# Patient Record
Sex: Female | Born: 1955 | Race: White | Hispanic: No | Smoking: Never smoker
Health system: Southern US, Community
[De-identification: ages and names within clinical notes are randomized; demographics above are authoritative.]

## PROBLEM LIST (undated history)

## (undated) DIAGNOSIS — K259 Gastric ulcer, unspecified as acute or chronic, without hemorrhage or perforation: Secondary | ICD-10-CM

## (undated) DIAGNOSIS — I10 Essential (primary) hypertension: Secondary | ICD-10-CM

## (undated) DIAGNOSIS — E7800 Pure hypercholesterolemia, unspecified: Secondary | ICD-10-CM

---

## 2000-02-23 ENCOUNTER — Ambulatory Visit (HOSPITAL_BASED_OUTPATIENT_CLINIC_OR_DEPARTMENT_OTHER): Admission: RE | Admit: 2000-02-23 | Discharge: 2000-02-23 | Payer: Self-pay | Admitting: Family Medicine

## 2004-05-13 ENCOUNTER — Ambulatory Visit: Payer: Self-pay | Admitting: Family Medicine

## 2004-06-16 DIAGNOSIS — K259 Gastric ulcer, unspecified as acute or chronic, without hemorrhage or perforation: Secondary | ICD-10-CM

## 2004-06-16 HISTORY — DX: Gastric ulcer, unspecified as acute or chronic, without hemorrhage or perforation: K25.9

## 2005-09-10 ENCOUNTER — Ambulatory Visit: Payer: Self-pay

## 2005-12-08 ENCOUNTER — Ambulatory Visit: Payer: Self-pay | Admitting: Unknown Physician Specialty

## 2006-04-15 ENCOUNTER — Ambulatory Visit: Payer: Self-pay | Admitting: Unknown Physician Specialty

## 2007-01-28 ENCOUNTER — Ambulatory Visit: Payer: Self-pay | Admitting: Family Medicine

## 2008-11-28 ENCOUNTER — Ambulatory Visit: Payer: Self-pay

## 2010-01-07 ENCOUNTER — Ambulatory Visit: Payer: Self-pay | Admitting: Family Medicine

## 2011-04-14 ENCOUNTER — Ambulatory Visit: Payer: Self-pay | Admitting: Family Medicine

## 2012-07-06 ENCOUNTER — Ambulatory Visit: Payer: Self-pay

## 2013-06-02 ENCOUNTER — Ambulatory Visit: Payer: Self-pay | Admitting: Internal Medicine

## 2013-06-02 LAB — DOT URINE DIP
Blood: NEGATIVE
Glucose,UR: NEGATIVE mg/dL (ref 0–75)
Specific Gravity: 1.025 (ref 1.003–1.030)

## 2013-07-11 ENCOUNTER — Ambulatory Visit: Payer: Self-pay

## 2014-05-17 ENCOUNTER — Ambulatory Visit: Payer: Self-pay

## 2014-05-17 LAB — DOT URINE DIP
Blood: NEGATIVE
Glucose,UR: NEGATIVE
PROTEIN: NEGATIVE
SPECIFIC GRAVITY: 1.02 (ref 1.000–1.030)

## 2014-09-29 ENCOUNTER — Other Ambulatory Visit: Payer: Self-pay | Admitting: Oncology

## 2014-09-29 DIAGNOSIS — Z139 Encounter for screening, unspecified: Secondary | ICD-10-CM

## 2014-11-01 ENCOUNTER — Ambulatory Visit: Payer: Self-pay

## 2014-11-01 ENCOUNTER — Ambulatory Visit: Payer: Self-pay | Attending: Oncology

## 2014-11-01 ENCOUNTER — Ambulatory Visit
Admission: RE | Admit: 2014-11-01 | Discharge: 2014-11-01 | Disposition: A | Discharge status: Home / Self Care | Payer: Self-pay | Source: Ambulatory Visit | Attending: Oncology | Admitting: Oncology

## 2014-11-01 VITALS — BP 111/75 | HR 60 | Temp 98.3°F | Resp 16 | Ht 62.6 in | Wt 175.3 lb

## 2014-11-01 DIAGNOSIS — Z0000 Encounter for general adult medical examination without abnormal findings: Secondary | ICD-10-CM

## 2014-11-01 NOTE — Progress Notes (Signed)
Subjective:     Patient ID: Kristi Howell, female   DOB: Nov 08, 1955, 59 y.o.   MRN: [id number]  HPI   Review of Systems     Objective:   Physical Exam  Pulmonary/Chest: Right breast exhibits no inverted nipple, no mass, no nipple discharge, no skin change and no tenderness. Left breast exhibits no inverted nipple, no mass, no nipple discharge, no skin change and no tenderness. Breasts are symmetrical.       Assessment:    Patient screened, and meets [facility] eligibility.  Patient does not have insurance, Medicare or Medicaid.  Handout given on Affordable Care Act.  [facility].CBE unremarkableInstructed patient on breast self-exam using teach back method     Plan:  Sent for bilateral screening mammogram.

## 2014-11-02 NOTE — Progress Notes (Unsigned)
Letter mailed from [facility] to notify of normal mammogram results.  Patient to return in one year for annual screening.  Copy to HSIS. 

## 2015-05-16 ENCOUNTER — Encounter: Payer: Self-pay | Admitting: Emergency Medicine

## 2015-05-16 ENCOUNTER — Ambulatory Visit
Admission: EM | Admit: 2015-05-16 | Discharge: 2015-05-16 | Disposition: A | Discharge status: Home / Self Care | Payer: Self-pay | Attending: Family Medicine | Admitting: Family Medicine

## 2015-05-16 DIAGNOSIS — Z021 Encounter for pre-employment examination: Secondary | ICD-10-CM

## 2015-05-16 DIAGNOSIS — Z024 Encounter for examination for driving license: Secondary | ICD-10-CM

## 2015-05-16 HISTORY — DX: Gastric ulcer, unspecified as acute or chronic, without hemorrhage or perforation: K25.9

## 2015-05-16 HISTORY — DX: Essential (primary) hypertension: I10

## 2015-05-16 HISTORY — DX: Pure hypercholesterolemia, unspecified: E78.00

## 2015-05-16 LAB — DEPT OF TRANSP DIPSTICK, URINE (ARMC ONLY)
Glucose, UA: NEGATIVE mg/dL
Protein, ur: NEGATIVE mg/dL
Specific Gravity, Urine: 1.02 (ref 1.005–1.030)

## 2015-05-16 NOTE — ED Provider Notes (Signed)
CSN: [id number]     Arrival date & time 05/16/15  0865 History   First MD Initiated Contact with Patient 05/16/15 [phone]    Nurses notes were reviewed. Chief Complaint  Patient presents with  . Employment Physical  DOT exam. Patient with history of hypertension , hyperlipidemia and peptic ulcer disease.  (Consider location/radiation/quality/duration/timing/severity/associated sxs/prior Treatment) The history is provided by the patient. No language interpreter was used.    Past Medical History  Diagnosis Date  . Hypertension   . Hypercholesterolemia   . Stomach ulcer 2006   History reviewed. No pertinent past surgical history. History reviewed. No pertinent family history. Social History  Substance Use Topics  . Smoking status: Never Smoker   . Smokeless tobacco: None  . Alcohol Use: No   OB History    No data available     Review of Systems  All other systems reviewed and are negative.   Allergies  Review of patient's allergies indicates no known allergies.  Home Medications   Prior to Admission medications   Medication Sig Start Date End Date Taking? Authorizing Provider  aspirin EC 81 MG tablet Take 81 mg by mouth daily.   Yes Historical Provider, MD  atorvastatin (LIPITOR) 20 MG tablet Take 20 mg by mouth daily.   Yes Historical Provider, MD  carvedilol (COREG) 25 MG tablet Take 25 mg by mouth 1 day or 1 dose.   Yes Historical Provider, MD  enalapril (VASOTEC) 20 MG tablet Take 20 mg by mouth 2 (two) times daily.   Yes Historical Provider, MD  omeprazole (PRILOSEC) 10 MG capsule Take 10 mg by mouth daily.   Yes Historical Provider, MD  triamterene-hydrochlorothiazide (DYAZIDE) 37.5-25 MG capsule Take 1 capsule by mouth daily.   Yes Historical Provider, MD   Meds Ordered and Administered this Visit  Medications - No data to display  BP 110/75 mmHg  Pulse 62  Temp(Src) 97.7 F (36.5 C) (Oral)  Resp 16  Ht  (1.575 m)  Wt 171 lb 8 oz (77.792 kg)  BMI 31.36  kg/m2  SpO2 98% No data found.   Physical Exam  Constitutional: She is oriented to person, place, and time. She appears well-developed and well-nourished.  HENT:  Head: Normocephalic and atraumatic.  Eyes: Pupils are equal, round, and reactive to light.  Neck: Normal range of motion. Neck supple.  Cardiovascular: Normal rate, regular rhythm and normal heart sounds.   Pulmonary/Chest: Effort normal and breath sounds normal. No respiratory distress.  Abdominal: Soft. Bowel sounds are normal. She exhibits no distension.  Musculoskeletal: Normal range of motion.  Neurological: She is alert and oriented to person, place, and time.  Skin: Skin is warm and dry.  Psychiatric: She has a normal mood and affect.  Vitals reviewed.   ED Course  Procedures (including critical care time)  Labs Review Labs Reviewed  DEPT OF TRANSP DIPSTICK, URINE([facility] ONLY) - Abnormal; Notable for the following:    Hgb urine dipstick 1+ (*)    All other components within normal limits   Results for orders placed or performed during the hospital encounter of 05/16/15  Dept of Transp dipstick, urine  Result Value Ref Range   Protein, ur NEGATIVE NEGATIVE mg/dL   Glucose, UA NEGATIVE NEGATIVE mg/dL   Specific Gravity, Urine 1.020 1.005 - 1.030   Hgb urine dipstick 1+ (A) NEGATIVE    Imaging Review No results found.   Visual Acuity Review  Right Eye Distance:   Left Eye Distance:  Bilateral Distance:    Right Eye Near:   Left Eye Near:    Bilateral Near:         MDM   1. 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 Patient did have hematuria and she she was informed of that finding and informed that she needs follow-up with the doctor in 2- 6 months for follow-up  Hassan RowanEugene Tahlia Deamer, MD 05/16/15 1458

## 2015-05-16 NOTE — ED Notes (Signed)
DOT physical. 

## 2015-10-08 ENCOUNTER — Other Ambulatory Visit: Payer: Self-pay | Admitting: Family Medicine

## 2015-11-07 ENCOUNTER — Ambulatory Visit: Payer: Self-pay | Attending: Oncology | Admitting: *Deleted

## 2015-11-07 ENCOUNTER — Ambulatory Visit
Admission: RE | Admit: 2015-11-07 | Discharge: 2015-11-07 | Disposition: A | Discharge status: Home / Self Care | Payer: Self-pay | Source: Ambulatory Visit | Attending: Oncology | Admitting: Oncology

## 2015-11-07 ENCOUNTER — Encounter: Payer: Self-pay | Admitting: *Deleted

## 2015-11-07 VITALS — BP 104/72 | HR 55 | Temp 97.6°F | Ht 64.17 in | Wt 176.4 lb

## 2015-11-07 DIAGNOSIS — Z0000 Encounter for general adult medical examination without abnormal findings: Secondary | ICD-10-CM

## 2015-11-07 NOTE — Patient Instructions (Signed)
Gave patient hand-out, Women Staying Healthy, Active and Well from BCCCP, with education on breast health, pap smears, heart and colon health. 

## 2015-11-07 NOTE — Progress Notes (Signed)
Subjective:     Patient ID: Kristi Howell, female   DOB: June 11, 1956, 60 y.o.   MRN: [id number]  HPI   Review of Systems     Objective:   Physical Exam  Pulmonary/Chest: Right breast exhibits no inverted nipple, no mass, no nipple discharge, no skin change and no tenderness. Left breast exhibits no inverted nipple, no mass, no nipple discharge, no skin change and no tenderness. Breasts are symmetrical.  Abdominal: There is no splenomegaly or hepatomegaly.  Genitourinary: No labial fusion. There is no rash, tenderness, lesion or injury on the right labia. There is no rash, tenderness, lesion or injury on the left labia. Cervix exhibits no motion tenderness, no discharge and no friability. Right adnexum displays no mass, no tenderness and no fullness. Left adnexum displays no mass, no tenderness and no fullness. No erythema, tenderness or bleeding in the vagina. No foreign body around the vagina. No signs of injury around the vagina. No vaginal discharge found.       Assessment:     60 year old White [facility]  Clinical breast exam unremarkable.  Taught self breast awareness.  Last pap 07/09/12 was negative, but  HPV was not tested.  Specimen collected for pap smear without difficulty.  Patient has been screened for eligibility.  She does not have any insurance, Medicare or Medicaid.  She also meets financial eligibility.  Hand-out given on the Affordable Care Act.    Plan:     Screening mammogram ordered.  Specimen for pap smear sent to the lab.  Will follow-up per B[facility]CP protocol.

## 2015-11-13 LAB — PAP LB AND HPV HIGH-RISK
HPV, high-risk: NEGATIVE
PAP SMEAR COMMENT: 0

## 2015-11-30 ENCOUNTER — Encounter: Payer: Self-pay | Admitting: *Deleted

## 2015-11-30 NOTE — Progress Notes (Signed)
Letter mailed to patient with her normal mammogram and pap results.  To follow up in one year with mammo and 5 years for her next pap.  HSIS to [place].

## 2016-05-13 ENCOUNTER — Ambulatory Visit: Admission: EM | Admit: 2016-05-13 | Discharge: 2016-05-13 | Discharge status: Absent without leave | Payer: Self-pay

## 2016-11-17 ENCOUNTER — Ambulatory Visit: Payer: Self-pay

## 2016-12-10 ENCOUNTER — Other Ambulatory Visit: Payer: Self-pay | Admitting: *Deleted

## 2016-12-10 ENCOUNTER — Encounter: Payer: Self-pay | Admitting: *Deleted

## 2016-12-10 ENCOUNTER — Ambulatory Visit
Admission: RE | Admit: 2016-12-10 | Discharge: 2016-12-10 | Disposition: A | Discharge status: Home / Self Care | Payer: Self-pay | Source: Ambulatory Visit | Attending: Oncology | Admitting: Oncology

## 2016-12-10 ENCOUNTER — Ambulatory Visit: Payer: Self-pay | Attending: Oncology | Admitting: *Deleted

## 2016-12-10 VITALS — BP 103/70 | HR 66 | Temp 97.1°F | Resp 18 | Ht 63.0 in | Wt 175.0 lb

## 2016-12-10 DIAGNOSIS — N6453 Retraction of nipple: Secondary | ICD-10-CM

## 2016-12-10 DIAGNOSIS — N63 Unspecified lump in unspecified breast: Secondary | ICD-10-CM

## 2016-12-10 NOTE — Patient Instructions (Signed)
Gave patient hand-out, Women Staying Healthy, Active and Well from BCCCP, with education on breast health, pap smears, heart and colon health. 

## 2016-12-10 NOTE — Progress Notes (Signed)
Subjective:     Patient ID: Kristi HeirKathleen Backus Howell, female   DOB: [birth year], 61 y.o.   MRN: [id number]  HPI   Review of Systems     Objective:   Physical Exam  Pulmonary/Chest: Right breast exhibits no inverted nipple, no mass, no nipple discharge, no skin change and no tenderness. Left breast exhibits inverted nipple and mass. Left breast exhibits no nipple discharge, no skin change and no tenderness. Breasts are asymmetrical.         Assessment:     61 year old White female [facility].  Last mammogram 11/07/15 was a birads one.  On clinical breast exam the left nipple is flat and inverts when she raises her arms.  There is also an approximate 3 cm mobile thickening noted at 1:00 left breast 3 cm from the areola.  The patient states these are new findings for her.  States she has never noticed that her nipple was inverted.  Taught self breast awareness.  Last pap on 11/07/15 was negative/negative.  Next pap due in 2022.  Encouraged pelvic exam next year.  She is agreeable. Patient has been screened for eligibility.  She does not have any insurance, Medicare or Medicaid.  She also meets financial eligibility.  Hand-out given on the Affordable Care Act.    Plan:     Bilateral diagnostic mammogram and ultrasound ordered.  If no findings on imaging will refer for surgical consult for further evaluation.  Will follow-up per B[facility]CP protocol.

## 2016-12-11 ENCOUNTER — Other Ambulatory Visit: Payer: Self-pay | Admitting: *Deleted

## 2016-12-11 DIAGNOSIS — N63 Unspecified lump in unspecified breast: Secondary | ICD-10-CM

## 2017-01-05 ENCOUNTER — Ambulatory Visit
Admission: RE | Admit: 2017-01-05 | Discharge: 2017-01-05 | Disposition: A | Discharge status: Home / Self Care | Payer: Self-pay | Source: Ambulatory Visit | Attending: Oncology | Admitting: Oncology

## 2017-01-05 DIAGNOSIS — N63 Unspecified lump in unspecified breast: Secondary | ICD-10-CM

## 2017-01-05 HISTORY — PX: BREAST BIOPSY: SHX20

## 2017-01-06 LAB — SURGICAL PATHOLOGY

## 2017-01-09 ENCOUNTER — Encounter: Payer: Self-pay | Admitting: *Deleted

## 2017-01-09 NOTE — Progress Notes (Unsigned)
Patient with benign breast biopsy.  She is to return in one year with annual screening.  HSIS to [place].

## 2018-01-18 ENCOUNTER — Ambulatory Visit: Payer: Self-pay | Attending: Oncology | Admitting: *Deleted

## 2018-01-18 ENCOUNTER — Encounter (INDEPENDENT_AMBULATORY_CARE_PROVIDER_SITE_OTHER): Payer: Self-pay

## 2018-01-18 ENCOUNTER — Encounter: Payer: Self-pay | Admitting: *Deleted

## 2018-01-18 ENCOUNTER — Ambulatory Visit
Admission: RE | Admit: 2018-01-18 | Discharge: 2018-01-18 | Disposition: A | Discharge status: Home / Self Care | Payer: Self-pay | Source: Ambulatory Visit | Attending: Oncology | Admitting: Oncology

## 2018-01-18 VITALS — BP 109/76 | HR 56 | Temp 97.6°F | Ht 62.0 in | Wt 175.0 lb

## 2018-01-18 DIAGNOSIS — Z0000 Encounter for general adult medical examination without abnormal findings: Secondary | ICD-10-CM | POA: Insufficient documentation

## 2018-01-18 NOTE — Progress Notes (Signed)
  Subjective:     Patient ID: Kristi Howell, female   DOB: 03-Aug-1955, 62 y.o.   MRN: [id number]  HPI   Review of Systems     Objective:   Physical Exam  Pulmonary/Chest: Right breast exhibits no inverted nipple, no mass, no nipple discharge, no skin change and no tenderness. Left breast exhibits inverted nipple. Left breast exhibits no mass, no nipple discharge, no skin change and no tenderness. Breasts are symmetrical.  Left nipple flatter than the right - patient states normal for her       Assessment:     62 year old White [facility]. 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  Clinical breast exam today unremarkable.   Taught self breast awareness.  Last pap on 11/07/15 was negative / negative.  Next pap due in 2022.  Patient has been screened for eligibility.  She does not have any insurance, Medicare or Medicaid.  She also meets financial eligibility.  Hand-out given on the Affordable Care Act. Risk Assessment    Risk Scores      01/18/2018   Last edited by: Jim LikeLambert, Liem Copenhaver M, RN   5-year risk: 1.5 %   Lifetime risk: 6.3 %             Plan:     Screening mammogram ordered.  Will follow-up per [facility] protocol.

## 2018-01-18 NOTE — Patient Instructions (Signed)
Gave patient hand-out, Women Staying Healthy, Active and Well from BCCCP, with education on breast health, pap smears, heart and colon health. 

## 2018-01-19 ENCOUNTER — Encounter: Payer: Self-pay | Admitting: *Deleted

## 2018-01-19 NOTE — Progress Notes (Signed)
Letter mailed from [facility] to inform patient of her normal mammogram results.  Patient is to follow-up with annual screening in one year.  HSIS to Christy. 

## 2019-01-23 IMAGING — US US BREAST*L* LIMITED INC AXILLA
1 series · 7 of 7 positions shown · non-contrast
Comparison: Previous exam(s).

CLINICAL DATA: 61-year-old female with left breast thickening at 1
o'clock and nipple retraction noted on exam by her referring
clinician

EXAM:
2D DIGITAL DIAGNOSTIC BILATERAL MAMMOGRAM WITH CAD AND ADJUNCT TOMO
ULTRASOUND LEFT BREAST

[Series 1: us breast*left* limited inc axilla · 0.06mm/px · 7 of 7 slices shown]
[im 1/7]
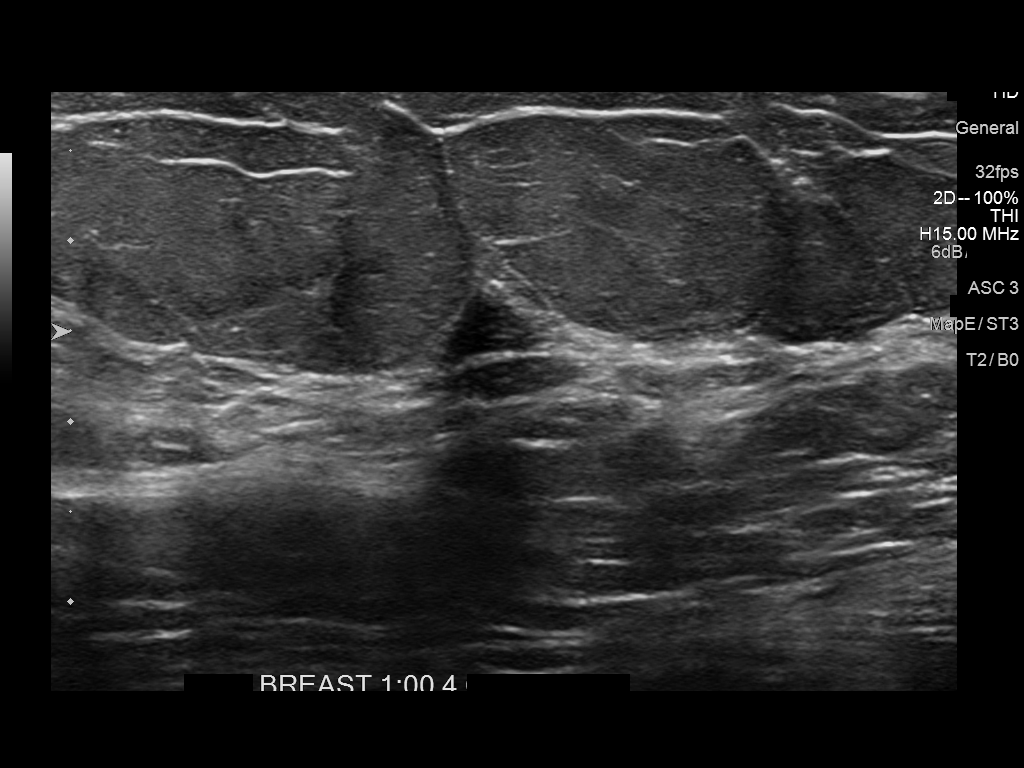
[im 2/7]
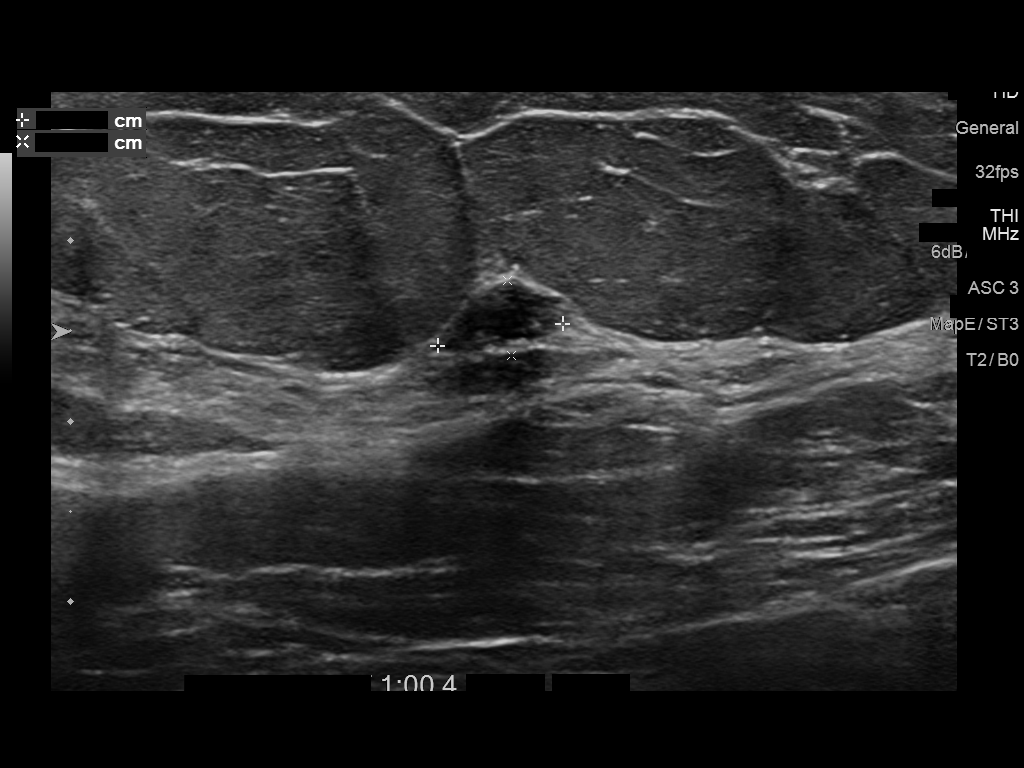
[im 3/7]
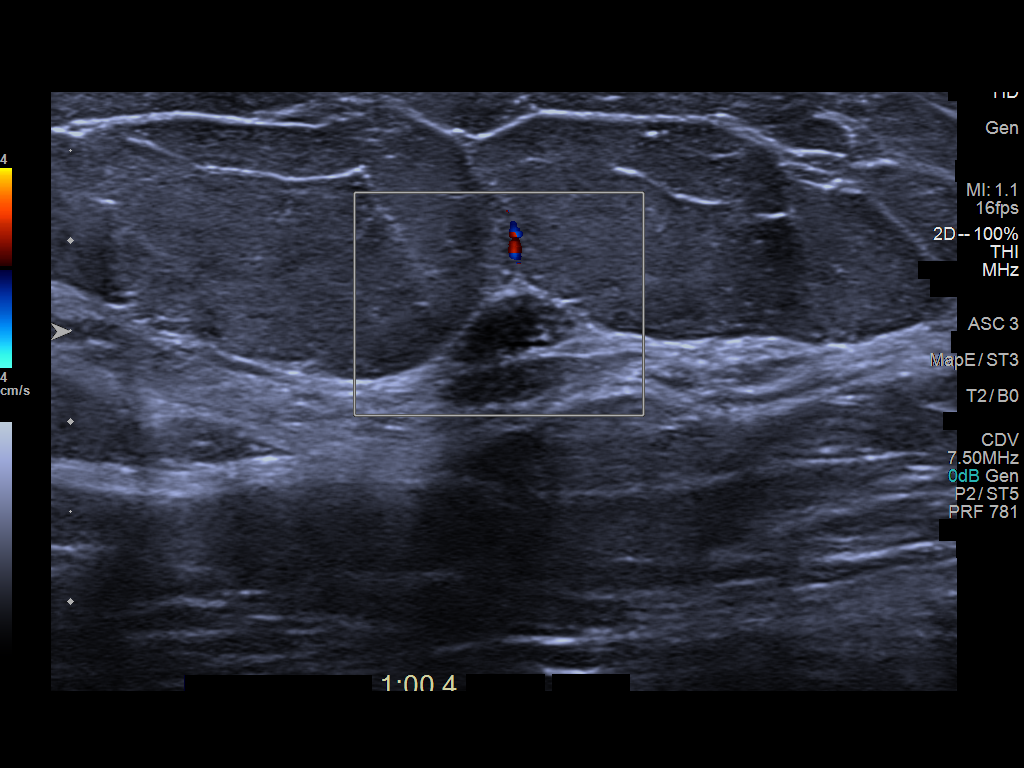
[im 4/7]
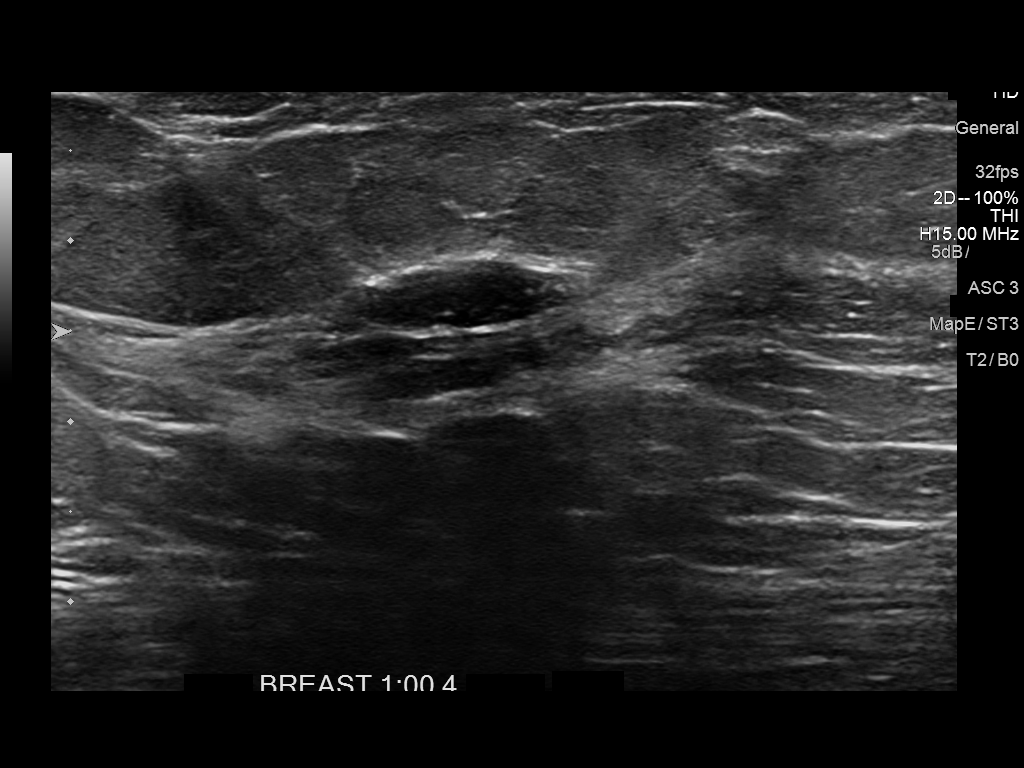
[im 5/7]
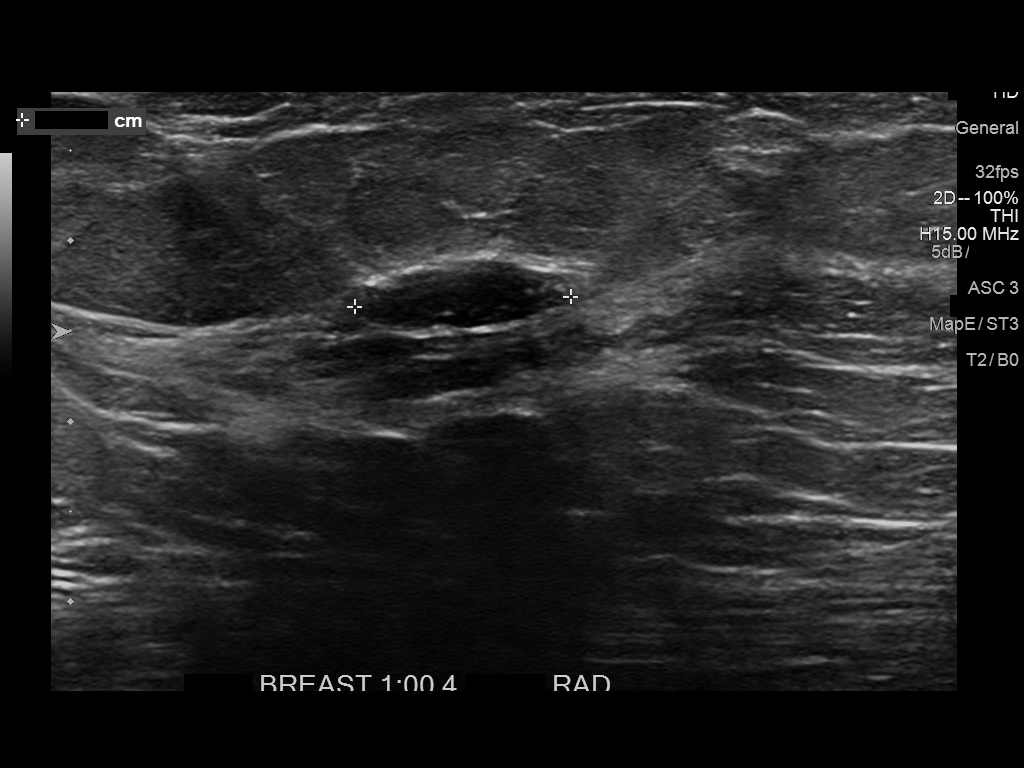
[im 6/7]
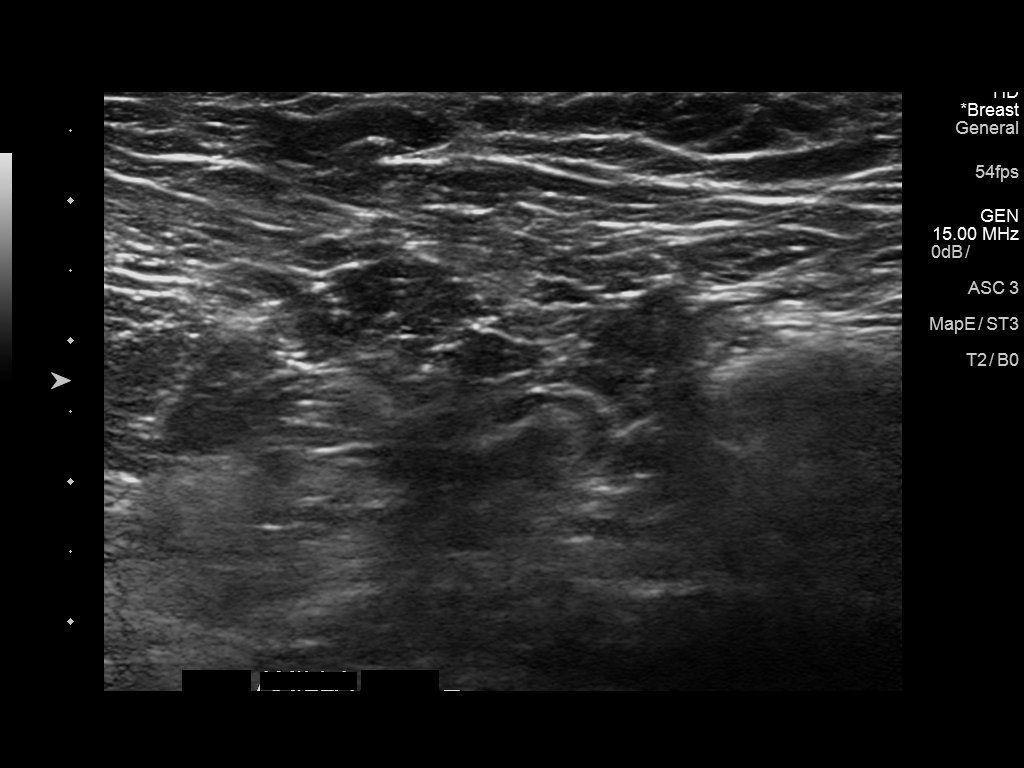
[im 7/7]
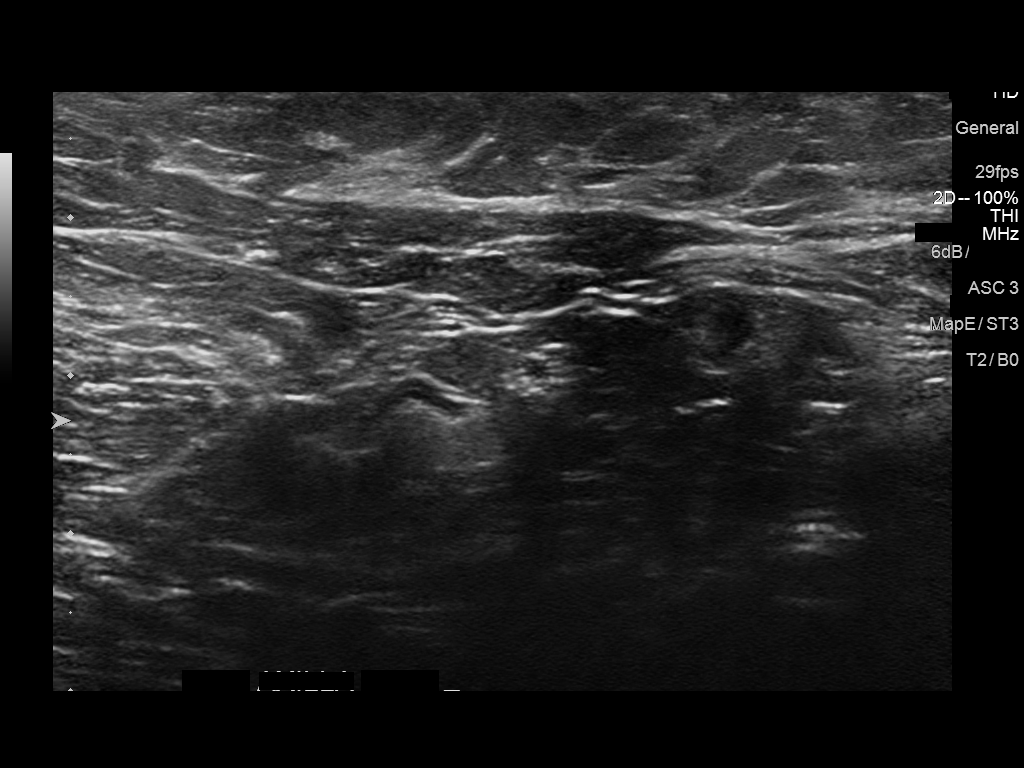

[7 of 7 positions shown; findings below may reference images not displayed]

ACR Breast Density Category b: There are scattered areas of
fibroglandular density.
FINDINGS: No suspicious mass, calcifications, or other abnormality is
identified within either breast.

Mammographic images were processed with CAD.

On physical exam, the left nipple is normal in appearance. No nipple
retraction is noted. There is slight thickening of the tissue in the
1-130 region without discrete mass.

Targeted ultrasound of the upper, outer left breast was performed
demonstrating an oval, hypoechoic mass with indistinct margins at 1
o'clock, 4 cm from the nipple measuring 12 x 7 x 4 mm. This finding
is likely incidental. No additional suspicious abnormality is
identified in the area of concern. Ultrasound of the left axilla
demonstrates no suspicious appearing axillary lymph nodes.
IMPRESSION: Indeterminate left breast mass.

RECOMMENDATION:
Ultrasound-guided left breast biopsy.

I have discussed the findings and recommendations with the patient.
Results were also provided in writing at the conclusion of the
visit. If applicable, a reminder letter will be sent to the patient
regarding the next appointment.

BI-RADS CATEGORY  4: Suspicious.

## 2019-02-23 ENCOUNTER — Ambulatory Visit: Payer: Self-pay

## 2019-02-25 ENCOUNTER — Telehealth: Payer: Self-pay

## 2019-02-25 NOTE — Telephone Encounter (Signed)
Called pt for pre-visit screening prior to [facility] appointment on 03/02/2019. Home # was the wrong # and mobile # - no answer / voicemail not set up.

## 2019-03-01 ENCOUNTER — Other Ambulatory Visit: Payer: Self-pay

## 2019-03-02 ENCOUNTER — Encounter: Payer: Self-pay | Admitting: *Deleted

## 2019-03-02 ENCOUNTER — Ambulatory Visit: Payer: Self-pay | Attending: Oncology | Admitting: *Deleted

## 2019-03-02 ENCOUNTER — Other Ambulatory Visit: Payer: Self-pay

## 2019-03-02 ENCOUNTER — Ambulatory Visit
Admission: RE | Admit: 2019-03-02 | Discharge: 2019-03-02 | Disposition: A | Discharge status: Home / Self Care | Payer: Self-pay | Source: Ambulatory Visit | Attending: Oncology | Admitting: Oncology

## 2019-03-02 VITALS — BP 113/68 | HR 57 | Temp 98.5°F | Ht 62.0 in | Wt 182.0 lb

## 2019-03-02 DIAGNOSIS — Z0000 Encounter for general adult medical examination without abnormal findings: Secondary | ICD-10-CM

## 2019-03-02 NOTE — Progress Notes (Signed)
  Subjective:     Patient ID: Kristi Howell, female   DOB: December 24, 1955, 63 y.o.   MRN: [id number]  HPI   Review of Systems     Objective:   Physical Exam Chest:     Breasts:        Right: No swelling, bleeding, inverted nipple, mass, nipple discharge, skin change or tenderness.        Left: Inverted nipple present. No swelling, bleeding, mass, nipple discharge, skin change or tenderness.    Lymphadenopathy:     Upper Body:     Right upper body: No supraclavicular or axillary adenopathy.     Left upper body: No supraclavicular or axillary adenopathy.        Assessment:     63 year old [place] female returns for annual screening.  Clinical breast exam reveals left inverted nipple.  Patient states this is normal for her.  Taught self breast awareness.  Patient has been screened for eligibility.  She does not have any insurance, Medicare or Medicaid.  She also meets financial eligibility.  Hand-out given on the Affordable Care Act. Risk Assessment    No risk assessment data for the current encounter   Risk Scores      01/18/2018   Last edited by: Rico Junker, RN   5-year risk: 1.5 %   Lifetime risk: 6.3 %            Plan:       Screening mammogram ordered.  Will follow up per [facility] protocol.

## 2019-03-09 ENCOUNTER — Encounter: Payer: Self-pay | Admitting: *Deleted

## 2019-03-09 NOTE — Progress Notes (Signed)
Letter mailed from [facility] to inform patient of her normal mammogram results.  Patient is to follow-up with annual screening in one year.  HSIS to Christy. 

## 2020-02-06 ENCOUNTER — Other Ambulatory Visit: Payer: Self-pay | Admitting: Family Medicine

## 2021-02-08 ENCOUNTER — Other Ambulatory Visit: Payer: Self-pay | Admitting: Family Medicine

## 2021-02-08 DIAGNOSIS — Z1231 Encounter for screening mammogram for malignant neoplasm of breast: Secondary | ICD-10-CM

## 2021-02-25 ENCOUNTER — Ambulatory Visit
Admission: RE | Admit: 2021-02-25 | Discharge: 2021-02-25 | Disposition: A | Discharge status: Home / Self Care | Payer: Medicare HMO | Source: Ambulatory Visit | Attending: Family Medicine | Admitting: Family Medicine

## 2021-02-25 ENCOUNTER — Other Ambulatory Visit: Payer: Self-pay

## 2021-02-25 DIAGNOSIS — Z1231 Encounter for screening mammogram for malignant neoplasm of breast: Secondary | ICD-10-CM | POA: Diagnosis present

## 2022-01-21 ENCOUNTER — Other Ambulatory Visit: Payer: Self-pay | Admitting: Family Medicine

## 2022-01-21 DIAGNOSIS — Z1231 Encounter for screening mammogram for malignant neoplasm of breast: Secondary | ICD-10-CM

## 2022-02-12 ENCOUNTER — Other Ambulatory Visit (HOSPITAL_COMMUNITY): Payer: Self-pay | Admitting: Family Medicine

## 2022-02-12 ENCOUNTER — Other Ambulatory Visit: Payer: Self-pay | Admitting: Family Medicine

## 2022-02-12 DIAGNOSIS — G459 Transient cerebral ischemic attack, unspecified: Secondary | ICD-10-CM

## 2022-02-12 DIAGNOSIS — E782 Mixed hyperlipidemia: Secondary | ICD-10-CM

## 2022-02-12 DIAGNOSIS — I10 Essential (primary) hypertension: Secondary | ICD-10-CM

## 2022-02-20 ENCOUNTER — Other Ambulatory Visit (HOSPITAL_COMMUNITY): Payer: Medicare HMO

## 2022-02-24 ENCOUNTER — Ambulatory Visit (HOSPITAL_COMMUNITY)
Admission: RE | Admit: 2022-02-24 | Discharge: 2022-02-24 | Disposition: A | Discharge status: Home / Self Care | Payer: Medicare HMO | Source: Ambulatory Visit | Attending: Family Medicine | Admitting: Family Medicine

## 2022-02-24 DIAGNOSIS — G459 Transient cerebral ischemic attack, unspecified: Secondary | ICD-10-CM | POA: Diagnosis present

## 2022-02-24 DIAGNOSIS — I10 Essential (primary) hypertension: Secondary | ICD-10-CM | POA: Diagnosis present

## 2022-02-24 DIAGNOSIS — E782 Mixed hyperlipidemia: Secondary | ICD-10-CM | POA: Diagnosis present

## 2022-02-26 ENCOUNTER — Ambulatory Visit
Admission: RE | Admit: 2022-02-26 | Discharge: 2022-02-26 | Disposition: A | Discharge status: Home / Self Care | Payer: Medicare HMO | Source: Ambulatory Visit | Attending: Family Medicine | Admitting: Family Medicine

## 2022-02-26 DIAGNOSIS — Z1231 Encounter for screening mammogram for malignant neoplasm of breast: Secondary | ICD-10-CM | POA: Insufficient documentation

## 2022-03-16 DIAGNOSIS — K219 Gastro-esophageal reflux disease without esophagitis: Secondary | ICD-10-CM | POA: Insufficient documentation

## 2022-03-16 DIAGNOSIS — I6529 Occlusion and stenosis of unspecified carotid artery: Secondary | ICD-10-CM | POA: Insufficient documentation

## 2022-03-16 DIAGNOSIS — I10 Essential (primary) hypertension: Secondary | ICD-10-CM | POA: Insufficient documentation

## 2022-03-16 DIAGNOSIS — E785 Hyperlipidemia, unspecified: Secondary | ICD-10-CM | POA: Insufficient documentation

## 2022-03-16 NOTE — Progress Notes (Signed)
MRN : [id number]  Kristi Howell is a 66 y.o. (1955-07-18) female who presents with chief complaint of check carotid arteries.  History of Present Illness:   The patient is seen for evaluation of carotid stenosis. The carotid stenosis was identified after patient experienced a possible TIA.  The patient denies amaurosis fugax. There is no recent history of TIA symptoms or focal motor deficits. There is no prior documented CVA.  There is no history of migraine headaches. There is no history of seizures.  The patient is taking enteric-coated aspirin 81 mg daily.  No recent shortening of the patient's walking distance or new symptoms consistent with claudication.  No history of rest pain symptoms. No new ulcers or wounds of the lower extremities have occurred.  There is no history of DVT, PE or superficial thrombophlebitis. No recent episodes of angina or shortness of breath documented.    Duplex ultrasound of the carotid arteries dated 02/24/2022 shows RICA 5-78% and LICA 46-96%.  No outpatient medications have been marked as taking for the 03/17/22 encounter (Appointment) with Delana Meyer, Dolores Lory, MD.    Past Medical History:  Diagnosis Date   Hypercholesterolemia    Hypertension    Stomach ulcer 2006    Past Surgical History:  Procedure Laterality Date   BREAST BIOPSY Left 01/05/2017    PSEUDO-ANGIOMATOUS STROMAL HYPERPLASIA    Social History Social History   Tobacco Use   Smoking status: Never  Substance Use Topics   Alcohol use: No    Family History Family History  Problem Relation Age of Onset   Breast cancer Neg Hx     No Known Allergies   REVIEW OF SYSTEMS (Negative unless checked)  Constitutional: [] Weight loss  [] Fever  [] Chills Cardiac: [] Chest pain   [] Chest pressure   [] Palpitations   [] Shortness of breath when laying flat   [] Shortness of breath with exertion. Vascular:  [x] Pain in legs with walking   [] Pain in legs at rest  [] History  of DVT   [] Phlebitis   [] Swelling in legs   [] Varicose veins   [] Non-healing ulcers Pulmonary:   [] Uses home oxygen   [] Productive cough   [] Hemoptysis   [] Wheeze  [] COPD   [] Asthma Neurologic:  [] Dizziness   [] Seizures   [] History of stroke   [] History of TIA  [] Aphasia   [] Vissual changes   [] Weakness or numbness in arm   [] Weakness or numbness in leg Musculoskeletal:   [] Joint swelling   [] Joint pain   [] Low back pain Hematologic:  [] Easy bruising  [] Easy bleeding   [] Hypercoagulable state   [] Anemic Gastrointestinal:  [] Diarrhea   [] Vomiting  [x] Gastroesophageal reflux/heartburn   [] Difficulty swallowing. Genitourinary:  [] Chronic kidney disease   [] Difficult urination  [] Frequent urination   [] Blood in urine Skin:  [] Rashes   [] Ulcers  Psychological:  [] History of anxiety   []  History of major depression.  Physical Examination  There were no vitals filed for this visit. There is no height or weight on file to calculate BMI. Gen: WD/WN, NAD Head: /AT, No temporalis wasting.  Ear/Nose/Throat: Hearing grossly intact, nares w/o erythema or drainage Eyes: PER, EOMI, sclera nonicteric.  Neck: Supple, no masses.  No bruit or JVD.  Pulmonary:  Good air movement, no audible wheezing, no use of accessory muscles.  Cardiac: RRR, normal S1, S2, no Murmurs. Vascular:   left carotid bruit noted Vessel Right Left  Radial Palpable Palpable  Carotid  Palpable  Palpable  Subclav  Palpable Palpable  Gastrointestinal: soft, non-distended. No guarding/no peritoneal signs.  Musculoskeletal: M/S 5/5 throughout.  No visible deformity.  Neurologic: CN 2-12 intact. Pain and light touch intact in extremities.  Symmetrical.  Speech is fluent. Motor exam as listed above. Psychiatric: Judgment intact, Mood & affect appropriate for pt's clinical situation. Dermatologic: No rashes or ulcers noted.  No changes consistent with cellulitis.   CBC No results found for: "WBC", "HGB", "HCT", "MCV",  "PLT"  BMET No results found for: "NA", "K", "CL", "CO2", "GLUCOSE", "BUN", "CREATININE", "CALCIUM", "GFRNONAA", "GFRAA" CrCl cannot be calculated (No successful lab value found.).  COAG No results found for: "INR", "PROTIME"  Radiology MM 3D SCREEN BREAST BILATERAL  Result Date: 02/27/2022 CLINICAL DATA:  Screening. EXAM: DIGITAL SCREENING BILATERAL MAMMOGRAM WITH TOMOSYNTHESIS AND CAD TECHNIQUE: Bilateral screening digital craniocaudal and mediolateral oblique mammograms were obtained. Bilateral screening digital breast tomosynthesis was performed. The images were evaluated with computer-aided detection. COMPARISON:  Previous exam(s). ACR Breast Density Category b: There are scattered areas of fibroglandular density. FINDINGS: There are no findings suspicious for malignancy. IMPRESSION: No mammographic evidence of malignancy. A result letter of this screening mammogram will be mailed directly to the patient. RECOMMENDATION: Screening mammogram in one year. (Code:SM-B-01Y) BI-RADS CATEGORY  1: Negative. Electronically Signed   By: Ammie Ferrier M.D.   On: 02/27/2022 11:53   US Carotid Bilateral  Result Date: 02/24/2022 CLINICAL DATA:  TIA.  History of hypertension and hyperlipidemia. EXAM: BILATERAL CAROTID DUPLEX ULTRASOUND TECHNIQUE: Pearline Cables scale imaging, color Doppler and duplex ultrasound were performed of bilateral carotid and vertebral arteries in the neck. COMPARISON:  None Available. FINDINGS: Criteria: Quantification of carotid stenosis is based on velocity parameters that correlate the residual internal carotid diameter with NASCET-based stenosis levels, using the diameter of the distal internal carotid lumen as the denominator for stenosis measurement. The following velocity measurements were obtained: RIGHT ICA: 104/35 cm/sec CCA: [id number] cm/sec SYSTOLIC ICA/CCA RATIO:  1.0 ECA: 77 cm/sec LEFT ICA: 155/69 cm/sec CCA: [id number] cm/sec SYSTOLIC ICA/CCA RATIO:  2.0 ECA: 69 cm/sec RIGHT CAROTID  ARTERY: There is a minimal amount of eccentric echogenic plaque within the right carotid bulb (image 18), not resulting in elevated peak systolic velocities within the interrogated course of the right internal carotid artery to suggest a hemodynamically significant stenosis. RIGHT VERTEBRAL ARTERY:  Antegrade flow LEFT CAROTID ARTERY: There is a minimal amount of eccentric echogenic plaque within the left carotid bulb (image 51 and 53), extending to involve the origin and proximal aspects of the left internal carotid artery (image 61). There is a moderate amount of eccentric echogenic shadowing plaque involving the mid aspect of the left internal carotid artery (image 66), which results in elevated peak systolic velocities within the mid aspect of the left internal carotid artery. Greatest acquired peak systolic velocity within mid left ICA measures 155 centimeters/second (image 68). LEFT VERTEBRAL ARTERY:  Antegrade flow IMPRESSION: 1. Moderate amount of left-sided atherosclerotic plaque results in elevated peak systolic velocities within the left internal carotid artery compatible with the 50-69% luminal narrowing range. Further evaluation with CTA could be performed as clinically indicated. 2. Minimal amount of right-sided atherosclerotic plaque, not resulting in a hemodynamically significant stenosis. Electronically Signed   By: Sandi Mariscal M.D.   On: 02/24/2022 15:59     Assessment/Plan 1. Bilateral carotid artery stenosis Recommend:  Given the patient's asymptomatic subcritical stenosis no further invasive testing or surgery at this time.  Duplex ultrasound of the carotid arteries dated 02/24/2022 shows RICA 123456 and LICA [id number].  Continue antiplatelet therapy as prescribed Continue management of CAD, HTN and Hyperlipidemia Healthy heart diet,  encouraged exercise at least 4 times per week Follow up in 6 months with duplex ultrasound and physical exam   - VAS US CAROTID; Future  2. Essential  hypertension Continue antihypertensive medications as already ordered, these medications have been reviewed and there are no changes at this time.   3. Mixed hyperlipidemia Continue statin as ordered and reviewed, no changes at this time   4. Gastroesophageal reflux disease, unspecified whether esophagitis present Continue PPI as already ordered, this medication has been reviewed and there are no changes at this time.  Avoidence of caffeine and alcohol  Moderate elevation of the head of the bed      Hortencia Pilar, MD  03/16/2022 8:27 PM

## 2022-03-17 ENCOUNTER — Encounter (INDEPENDENT_AMBULATORY_CARE_PROVIDER_SITE_OTHER): Payer: Self-pay | Admitting: Vascular Surgery

## 2022-03-17 ENCOUNTER — Ambulatory Visit (INDEPENDENT_AMBULATORY_CARE_PROVIDER_SITE_OTHER): Payer: Medicare HMO | Admitting: Vascular Surgery

## 2022-03-17 DIAGNOSIS — K219 Gastro-esophageal reflux disease without esophagitis: Secondary | ICD-10-CM | POA: Diagnosis not present

## 2022-03-17 DIAGNOSIS — I10 Essential (primary) hypertension: Secondary | ICD-10-CM

## 2022-03-17 DIAGNOSIS — I6523 Occlusion and stenosis of bilateral carotid arteries: Secondary | ICD-10-CM | POA: Diagnosis not present

## 2022-03-17 DIAGNOSIS — E782 Mixed hyperlipidemia: Secondary | ICD-10-CM | POA: Diagnosis not present

## 2022-03-23 ENCOUNTER — Encounter (INDEPENDENT_AMBULATORY_CARE_PROVIDER_SITE_OTHER): Payer: Self-pay | Admitting: Vascular Surgery

## 2022-04-14 ENCOUNTER — Encounter (INDEPENDENT_AMBULATORY_CARE_PROVIDER_SITE_OTHER): Payer: Self-pay

## 2022-09-16 ENCOUNTER — Ambulatory Visit (INDEPENDENT_AMBULATORY_CARE_PROVIDER_SITE_OTHER): Payer: Medicare HMO | Admitting: Nurse Practitioner

## 2022-09-16 ENCOUNTER — Encounter (INDEPENDENT_AMBULATORY_CARE_PROVIDER_SITE_OTHER): Payer: Medicare HMO

## 2022-09-22 ENCOUNTER — Ambulatory Visit (INDEPENDENT_AMBULATORY_CARE_PROVIDER_SITE_OTHER): Payer: Medicare HMO | Admitting: Nurse Practitioner

## 2022-09-22 ENCOUNTER — Encounter (INDEPENDENT_AMBULATORY_CARE_PROVIDER_SITE_OTHER): Payer: Medicare HMO

## 2022-10-24 ENCOUNTER — Other Ambulatory Visit (INDEPENDENT_AMBULATORY_CARE_PROVIDER_SITE_OTHER): Payer: Self-pay | Admitting: Vascular Surgery

## 2022-10-24 DIAGNOSIS — I6523 Occlusion and stenosis of bilateral carotid arteries: Secondary | ICD-10-CM

## 2022-10-28 ENCOUNTER — Encounter (INDEPENDENT_AMBULATORY_CARE_PROVIDER_SITE_OTHER): Payer: Self-pay | Admitting: Nurse Practitioner

## 2022-10-28 ENCOUNTER — Ambulatory Visit (INDEPENDENT_AMBULATORY_CARE_PROVIDER_SITE_OTHER): Payer: Medicare HMO

## 2022-10-28 ENCOUNTER — Ambulatory Visit (INDEPENDENT_AMBULATORY_CARE_PROVIDER_SITE_OTHER): Payer: Medicare HMO | Admitting: Nurse Practitioner

## 2022-10-28 VITALS — BP 128/79 | HR 60 | Resp 16 | Wt 155.8 lb

## 2022-10-28 DIAGNOSIS — I6523 Occlusion and stenosis of bilateral carotid arteries: Secondary | ICD-10-CM | POA: Diagnosis not present

## 2022-10-28 DIAGNOSIS — I10 Essential (primary) hypertension: Secondary | ICD-10-CM

## 2022-10-28 DIAGNOSIS — E782 Mixed hyperlipidemia: Secondary | ICD-10-CM | POA: Diagnosis not present

## 2022-10-30 NOTE — Progress Notes (Signed)
Subjective:    Patient ID: Kristi Howell, female    DOB: 05-07-1956, 68 y.o.   MRN: [id number] Chief Complaint  Patient presents with   Follow-up    Ultrasound follow up    The patient is seen for follow up evaluation of carotid stenosis. The carotid stenosis followed by ultrasound.   The patient denies amaurosis fugax. There is no recent history of TIA symptoms or focal motor deficits. There is no prior documented CVA.  The patient is taking enteric-coated aspirin 81 mg daily.  There is no history of migraine headaches. There is no history of seizures.  No recent shortening of the patient's walking distance or new symptoms consistent with claudication.  No history of rest pain symptoms. No new ulcers or wounds of the lower extremities have occurred.  There is no history of DVT, PE or superficial thrombophlebitis. No documented recent episodes of angina or shortness of breath documented.   Carotid Duplex done today shows 1 to 39% stenosis bilaterally which is a decrease in velocities of the left.    Review of Systems  All other systems reviewed and are negative.      Objective:   Physical Exam Vitals reviewed.  HENT:     Head: Normocephalic.  Neck:     Vascular: No carotid bruit.  Cardiovascular:     Rate and Rhythm: Normal rate and regular rhythm.     Pulses:          Radial pulses are 2+ on the right side and 2+ on the left side.  Pulmonary:     Effort: Pulmonary effort is normal.  Skin:    General: Skin is warm and dry.  Neurological:     Mental Status: She is alert and oriented to person, place, and time.  Psychiatric:        Mood and Affect: Mood normal.        Behavior: Behavior normal.        Thought Content: Thought content normal.        Judgment: Judgment normal.     BP 128/79 (BP Location: Left Arm)   Pulse 60   Resp 16   Wt 155 lb 12.8 oz (70.7 kg)   LMP 11/06/2005 (Approximate)   BMI 28.50 kg/m   Past Medical History:  Diagnosis  Date   Hypercholesterolemia    Hypertension    Stomach ulcer 2006    Social History   Socioeconomic History   Marital status: Widowed    Spouse name: Not on file   Number of children: Not on file   Years of education: Not on file   Highest education level: Not on file  Occupational History   Not on file  Tobacco Use   Smoking status: Never   Smokeless tobacco: Not on file  Substance and Sexual Activity   Alcohol use: No   Drug use: Not on file   Sexual activity: Not on file  Other Topics Concern   Not on file  Social History Narrative   Not on file   Social Determinants of Health   Financial Resource Strain: Not on file  Food Insecurity: Not on file  Transportation Needs: Not on file  Physical Activity: Not on file  Stress: Not on file  Social Connections: Not on file  Intimate Partner Violence: Not on file    Past Surgical History:  Procedure Laterality Date   BREAST BIOPSY Left 01/05/2017    PSEUDO-ANGIOMATOUS STROMAL HYPERPLASIA    Family  History  Problem Relation Age of Onset   Stroke Mother    Heart attack Mother    Obesity Mother    Diabetes Father    Heart disease Father    Heart attack Father    Breast cancer Neg Hx     No Known Allergies      No data to display            CMP  No results found for: "NA", "K", "CL", "CO2", "GLUCOSE", "BUN", "CREATININE", "CALCIUM", "PROT", "ALBUMIN", "AST", "ALT", "ALKPHOS", "BILITOT", "GFRNONAA", "GFRAA"   No results found.     Assessment & Plan:   1. Bilateral carotid artery stenosis Recommend:  Given the patient's asymptomatic subcritical stenosis no further invasive testing or surgery at this time.  Duplex ultrasound shows 1-39% stenosis bilaterally.  There is a small decrease in velocities in the left.  Due to the fact that the previous measurements were 50 to 69% we will maintain close follow-up in 6 months.  Continue antiplatelet therapy as prescribed Continue management of CAD, HTN  and Hyperlipidemia Healthy heart diet,  encouraged exercise at least 4 times per week Follow up in 6 months with duplex ultrasound and physical exam   2. Essential hypertension Continue antihypertensive medications as already ordered, these medications have been reviewed and there are no changes at this time.  3. Mixed hyperlipidemia Continue statin as ordered and reviewed, no changes at this time   Current Outpatient Medications on File Prior to Visit  Medication Sig Dispense Refill   Ascorbic Acid (VITAMIN C WITH ROSE HIPS) 500 MG tablet Take 500 mg by mouth daily.     aspirin EC 81 MG tablet Take 81 mg by mouth daily.     atorvastatin (LIPITOR) 20 MG tablet Take 20 mg by mouth daily.     carvedilol (COREG) 25 MG tablet Take 25 mg by mouth 2 (two) times daily with a meal.     enalapril (VASOTEC) 20 MG tablet Take 20 mg by mouth 2 (two) times daily.     Eszopiclone 3 MG TABS Take 3 mg by mouth at bedtime as needed.     hydrochlorothiazide (HYDRODIURIL) 25 MG tablet Take 25 mg by mouth daily.     Multiple Vitamins-Minerals (MULTIVITAMIN WOMEN 50+ PO) Take 1 capsule by mouth daily.     omeprazole (PRILOSEC) 10 MG capsule Take 10 mg by mouth daily.     Vitamin D, Cholecalciferol, 10 MCG (400 UNIT) CAPS Take 1 capsule by mouth daily.     triamterene-hydrochlorothiazide (DYAZIDE) 37.5-25 MG capsule Take 1 capsule by mouth daily. (Patient not taking: Reported on 03/17/2022)     No current facility-administered medications on file prior to visit.    There are no Patient Instructions on file for this visit. No follow-ups on file.   Georgiana Spinner, NP

## 2023-01-20 ENCOUNTER — Other Ambulatory Visit: Payer: Self-pay | Admitting: Family Medicine

## 2023-01-20 DIAGNOSIS — Z780 Asymptomatic menopausal state: Secondary | ICD-10-CM

## 2023-01-30 ENCOUNTER — Other Ambulatory Visit: Payer: Self-pay | Admitting: Family Medicine

## 2023-01-30 DIAGNOSIS — Z1231 Encounter for screening mammogram for malignant neoplasm of breast: Secondary | ICD-10-CM

## 2023-01-30 DIAGNOSIS — Z780 Asymptomatic menopausal state: Secondary | ICD-10-CM

## 2023-04-27 ENCOUNTER — Other Ambulatory Visit: Payer: Medicare HMO

## 2023-04-29 ENCOUNTER — Other Ambulatory Visit (INDEPENDENT_AMBULATORY_CARE_PROVIDER_SITE_OTHER): Payer: Self-pay | Admitting: Vascular Surgery

## 2023-04-29 DIAGNOSIS — I6523 Occlusion and stenosis of bilateral carotid arteries: Secondary | ICD-10-CM

## 2023-04-30 ENCOUNTER — Encounter (INDEPENDENT_AMBULATORY_CARE_PROVIDER_SITE_OTHER): Payer: Medicare HMO

## 2023-04-30 ENCOUNTER — Ambulatory Visit (INDEPENDENT_AMBULATORY_CARE_PROVIDER_SITE_OTHER): Payer: Medicare HMO | Admitting: Nurse Practitioner

## 2023-05-04 ENCOUNTER — Ambulatory Visit (INDEPENDENT_AMBULATORY_CARE_PROVIDER_SITE_OTHER): Payer: Medicare HMO

## 2023-05-04 ENCOUNTER — Ambulatory Visit (INDEPENDENT_AMBULATORY_CARE_PROVIDER_SITE_OTHER): Payer: Medicare HMO | Admitting: Nurse Practitioner

## 2023-05-04 ENCOUNTER — Encounter (INDEPENDENT_AMBULATORY_CARE_PROVIDER_SITE_OTHER): Payer: Self-pay | Admitting: Nurse Practitioner

## 2023-05-04 VITALS — BP 137/83 | HR 64 | Resp 16 | Wt 157.2 lb

## 2023-05-04 DIAGNOSIS — R0789 Other chest pain: Secondary | ICD-10-CM

## 2023-05-04 DIAGNOSIS — I6523 Occlusion and stenosis of bilateral carotid arteries: Secondary | ICD-10-CM

## 2023-05-04 DIAGNOSIS — E782 Mixed hyperlipidemia: Secondary | ICD-10-CM | POA: Diagnosis not present

## 2023-05-04 DIAGNOSIS — I10 Essential (primary) hypertension: Secondary | ICD-10-CM | POA: Diagnosis not present

## 2023-05-05 ENCOUNTER — Encounter (INDEPENDENT_AMBULATORY_CARE_PROVIDER_SITE_OTHER): Payer: Self-pay | Admitting: Nurse Practitioner

## 2023-05-05 NOTE — Progress Notes (Signed)
Subjective:    Patient ID: Kristi Howell, female    DOB: [birth year], 67 y.o.   MRN: [id number] Chief Complaint  Patient presents with   Follow-up    6 month carotid    The patient is seen for follow up evaluation of carotid stenosis. The carotid stenosis followed by ultrasound.    The patient denies amaurosis fugax. There is no recent history of TIA symptoms or focal motor deficits. There is no prior documented CVA.   The patient is taking enteric-coated aspirin 81 mg daily.   There is no history of migraine headaches. There is no history of seizures.   No recent shortening of the patient's walking distance or new symptoms consistent with claudication.  No history of rest pain symptoms. No new ulcers or wounds of the lower extremities have occurred.   There is no history of DVT, PE or superficial thrombophlebitis. She notes that she recently been having some chest tightness/pressure.  It does not happen daily but it does happen after she exerted herself for short time.  Once she has sat and rested the pain goes away.   Carotid Duplex done today shows 40 to 59% stenosis on the right ICA with 60 to 79% of the left    Review of Systems  Respiratory:  Positive for chest tightness.   All other systems reviewed and are negative.      Objective:   Physical Exam  BP 137/83 (BP Location: Left Arm)   Pulse 64   Resp 16   Wt 157 lb 3.2 oz (71.3 kg)   LMP 11/06/2005 (Approximate)   BMI 28.75 kg/m   Past Medical History:  Diagnosis Date   Hypercholesterolemia    Hypertension    Stomach ulcer 2006    Social History   Socioeconomic History   Marital status: Widowed    Spouse name: Not on file   Number of children: Not on file   Years of education: Not on file   Highest education level: Not on file  Occupational History   Not on file  Tobacco Use   Smoking status: Never   Smokeless tobacco: Not on file  Substance and Sexual Activity   Alcohol use: No   Drug  use: Not on file   Sexual activity: Not on file  Other Topics Concern   Not on file  Social History Narrative   Not on file   Social Determinants of Health   Financial Resource Strain: Not on file  Food Insecurity: Not on file  Transportation Needs: Not on file  Physical Activity: Not on file  Stress: Not on file  Social Connections: Not on file  Intimate Partner Violence: Not on file    Past Surgical History:  Procedure Laterality Date   BREAST BIOPSY Left 01/05/2017    PSEUDO-ANGIOMATOUS STROMAL HYPERPLASIA    Family History  Problem Relation Age of Onset   Stroke Mother    Heart attack Mother    Obesity Mother    Diabetes Father    Heart disease Father    Heart attack Father    Breast cancer Neg Hx     No Known Allergies      No data to display            CMP  No results found for: "NA", "K", "CL", "CO2", "GLUCOSE", "BUN", "CREATININE", "CALCIUM", "PROT", "ALBUMIN", "AST", "ALT", "ALKPHOS", "BILITOT", "GFR", "EGFR", "GFRNONAA"   No results found.     Assessment & Plan:  1. Bilateral carotid artery stenosis Recommend:   Given the patient's asymptomatic subcritical stenosis no further invasive testing or surgery at this time.   Duplex ultrasound shows 40 to 59% stenosis of the right ICA with 60 to 79% stenosis of the left.  Vertebral arteries have antegrade flow with normal flow hemodynamics in the bilateral subclavian arteries.    Continue antiplatelet therapy as prescribed Continue management of CAD, HTN and Hyperlipidemia Healthy heart diet,  encouraged exercise at least 4 times per week Follow up in 6 months with duplex ultrasound and physical exam   2. Essential hypertension Continue antihypertensive medications as already ordered, these medications have been reviewed and there are no changes at this time.  3. Mixed hyperlipidemia Continue cardiac and antihypertensive medications as already ordered and reviewed, no changes at this  time.  Continue statin as ordered and reviewed, no changes at this time  Nitrates PRN for chest pain  4. Chest pressure She could be has some chest pressure or tightness that happens after she exerts herself but resolves with rest.  This is concerning for angina and given her history of carotid disease, I think it would be wise to have the patient evaluated by cardiology.  Referral was placed - Ambulatory referral to Cardiology   Current Outpatient Medications on File Prior to Visit  Medication Sig Dispense Refill   Ascorbic Acid (VITAMIN C WITH ROSE HIPS) 500 MG tablet Take 500 mg by mouth daily.     aspirin EC 81 MG tablet Take 81 mg by mouth daily.     atorvastatin (LIPITOR) 20 MG tablet Take 20 mg by mouth daily.     carvedilol (COREG) 25 MG tablet Take 25 mg by mouth 2 (two) times daily with a meal.     enalapril (VASOTEC) 20 MG tablet Take 20 mg by mouth 2 (two) times daily.     Eszopiclone 3 MG TABS Take 3 mg by mouth at bedtime as needed.     hydrochlorothiazide (HYDRODIURIL) 25 MG tablet Take 25 mg by mouth daily.     Multiple Vitamins-Minerals (MULTIVITAMIN WOMEN 50+ PO) Take 1 capsule by mouth daily.     omeprazole (PRILOSEC) 10 MG capsule Take 10 mg by mouth daily.     Vitamin D, Cholecalciferol, 10 MCG (400 UNIT) CAPS Take 1 capsule by mouth daily.     triamterene-hydrochlorothiazide (DYAZIDE) 37.5-25 MG capsule Take 1 capsule by mouth daily. (Patient not taking: Reported on 03/17/2022)     No current facility-administered medications on file prior to visit.    There are no Patient Instructions on file for this visit. No follow-ups on file.   Georgiana Spinner, NP

## 2023-05-07 ENCOUNTER — Encounter: Payer: Self-pay | Admitting: *Deleted

## 2023-05-07 ENCOUNTER — Telehealth: Payer: Self-pay | Admitting: *Deleted

## 2023-05-07 IMAGING — MG MM DIGITAL SCREENING BILAT W/ TOMO AND CAD
6 of 10 series · 6 of 30 positions shown · non-contrast
Comparison: Previous exam(s).

CLINICAL DATA: Screening.

EXAM:
DIGITAL SCREENING BILATERAL MAMMOGRAM WITH TOMOSYNTHESIS AND CAD
TECHNIQUE: Bilateral screening digital craniocaudal and mediolateral oblique
mammograms were obtained. Bilateral screening digital breast
tomosynthesis was performed. The images were evaluated with
computer-aided detection.

[R CC synth-2D]
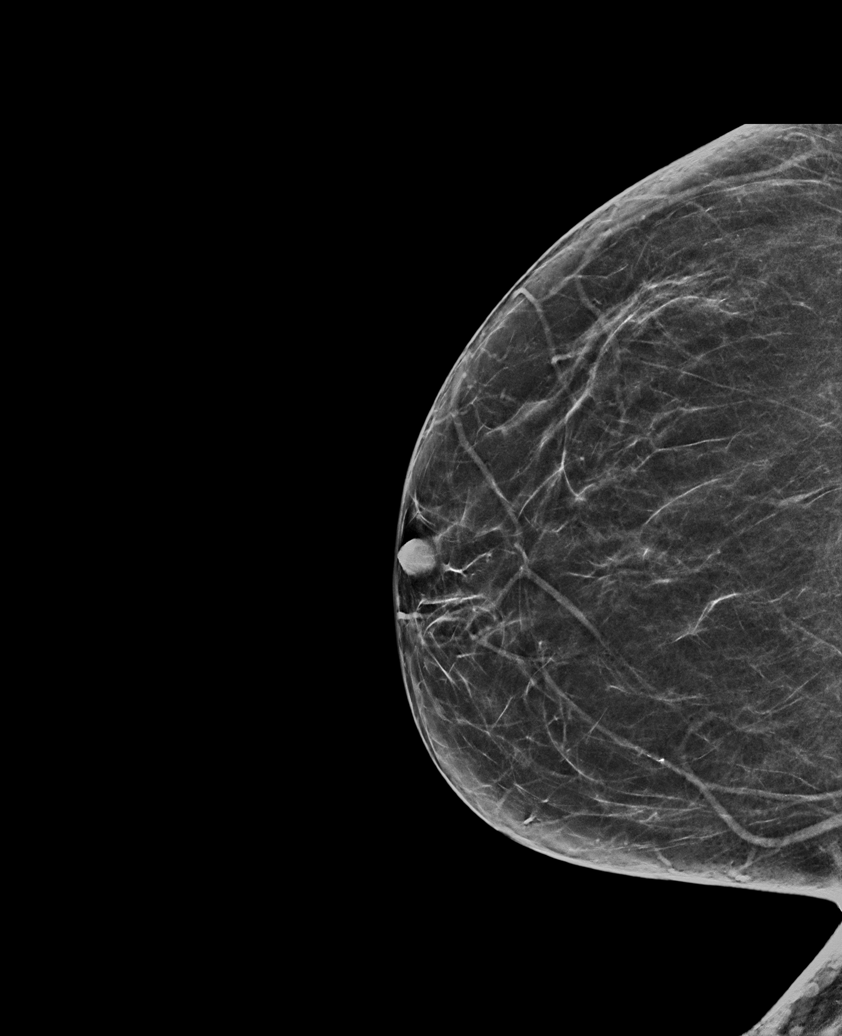

[L CC synth-2D]
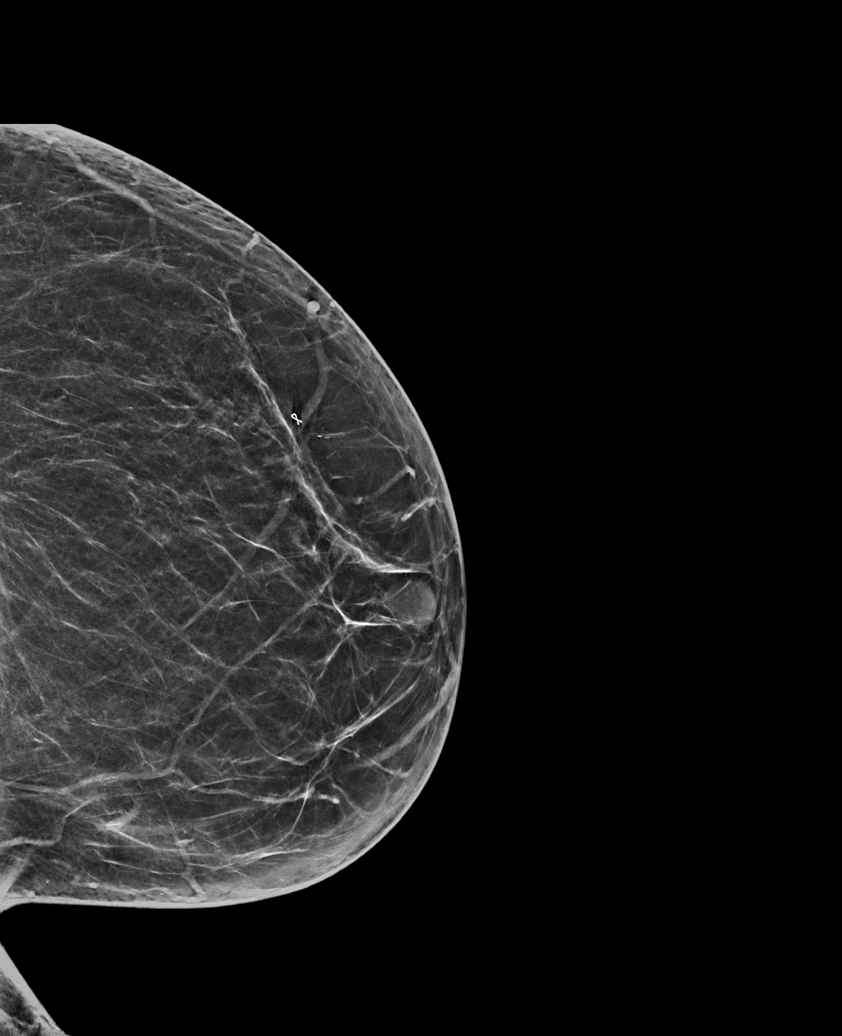

[L MLO synth-2D (1 of 2)]
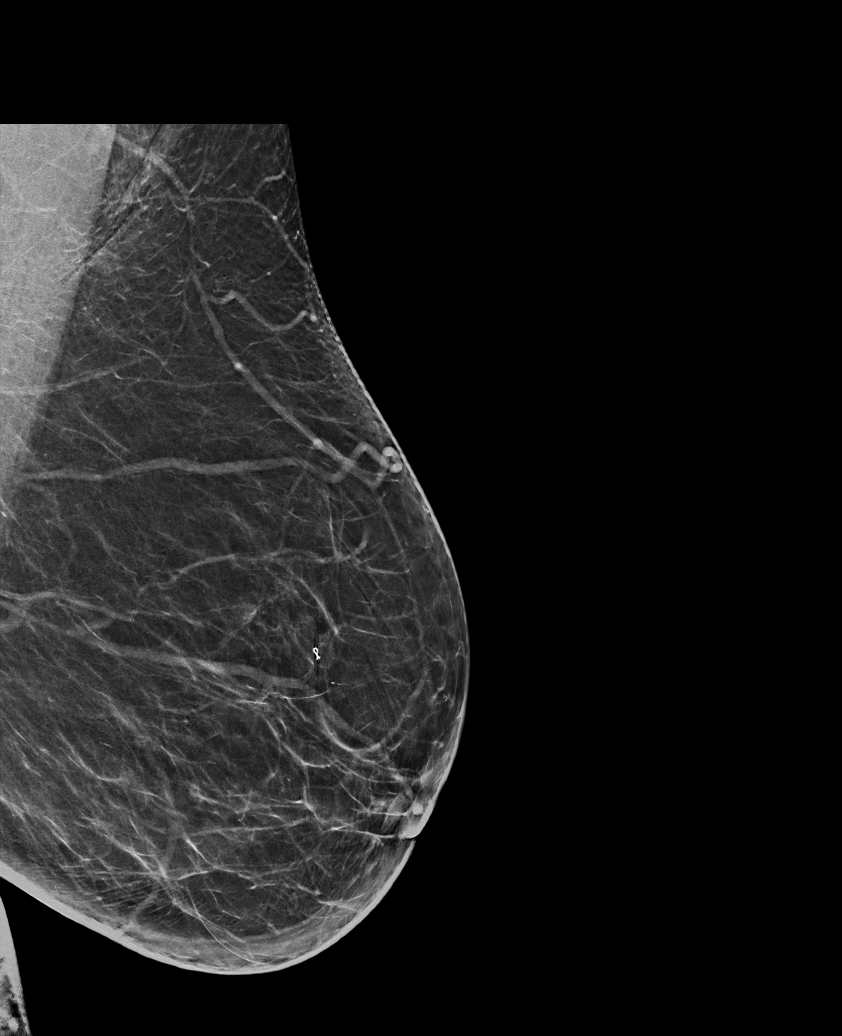

[L MLO synth-2D (2 of 2)]
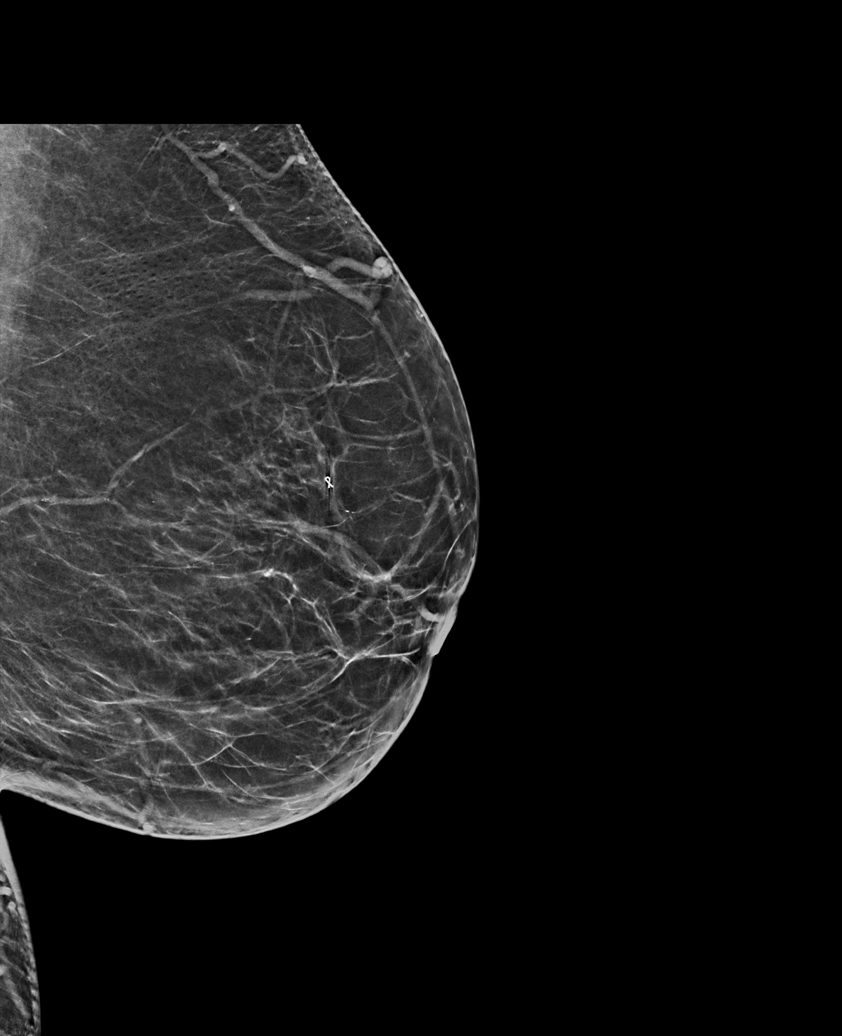

[R MLO synth-2D]
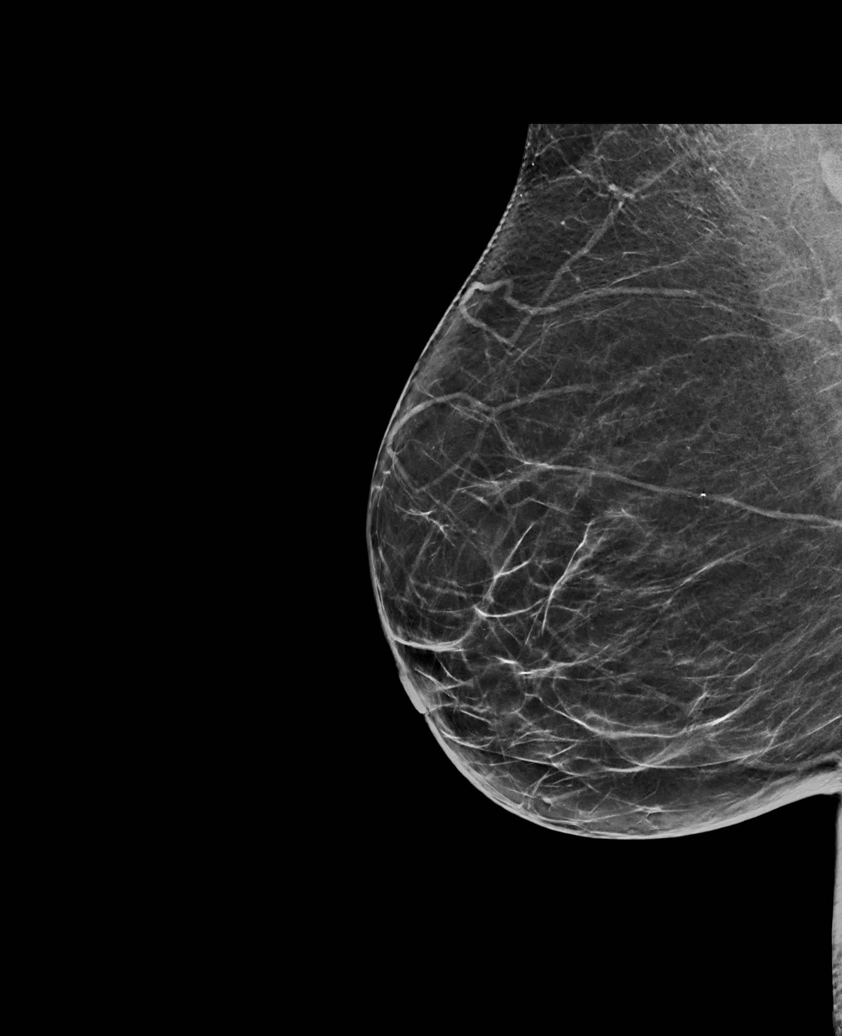

[L CC tomo · tomo slice 31/62.0]
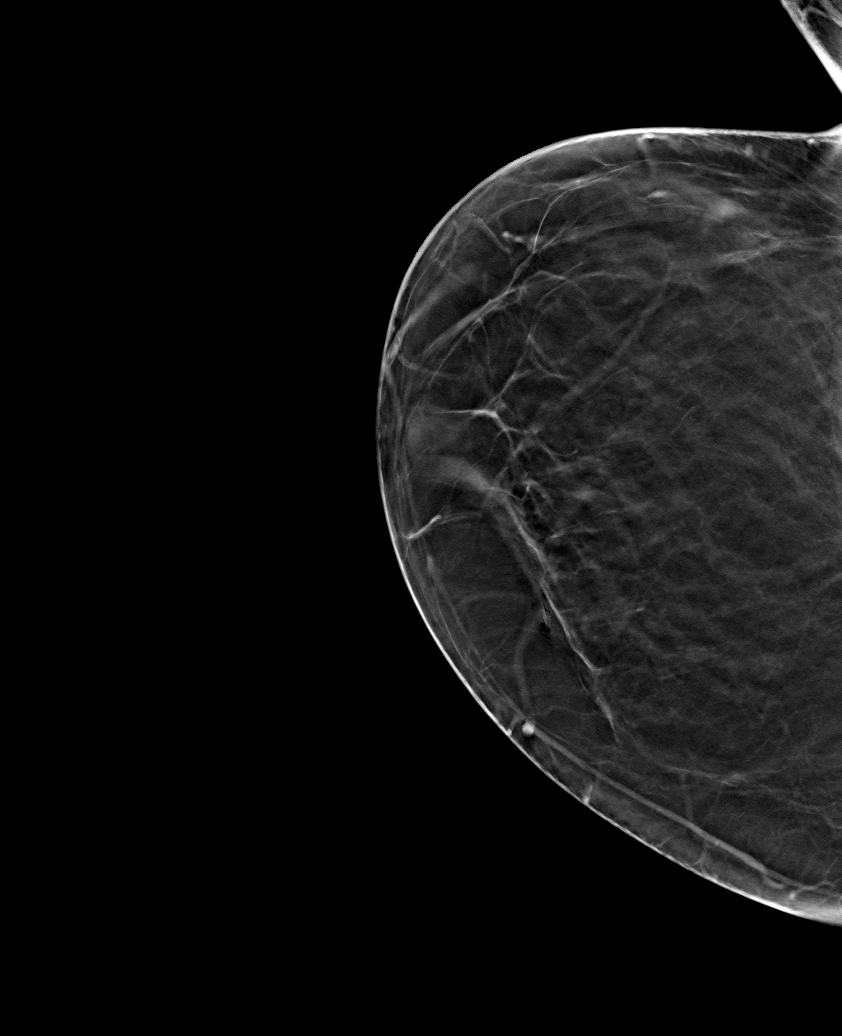

[6 of 30 positions shown; findings below may reference images not displayed]

ACR Breast Density Category b: There are scattered areas of
fibroglandular density.
FINDINGS: There are no findings suspicious for malignancy.
IMPRESSION: No mammographic evidence of malignancy. A result letter of this
screening mammogram will be mailed directly to the patient.

RECOMMENDATION:
Screening mammogram in one year. (Code:51-O-LD2)

BI-RADS CATEGORY  1: Negative.

## 2023-05-07 NOTE — Telephone Encounter (Signed)
Lmovm to verify pt's card hx.

## 2023-05-08 ENCOUNTER — Ambulatory Visit: Payer: Medicare HMO | Admitting: Cardiovascular Disease

## 2023-05-08 DIAGNOSIS — I739 Peripheral vascular disease, unspecified: Secondary | ICD-10-CM | POA: Insufficient documentation

## 2023-05-08 NOTE — Progress Notes (Deleted)
Cardiology Office Note  Date:  05/08/2023   ID:  Kristi, Howell 12-29-1955, MRN [id number]  PCP:  Leanna Sato, MD   No chief complaint on file.   HPI:  [name] is a 67 year old woman with past medical history of PAD, carotid stenosis Essential hypertension Hyperlipidemia Who presents by referral from [facility] for chest pressure  Recently seen by vascular May 04, 2023 Reported having some chest pressure and tightness that happens after exertion resolving with rest   Carotid ultrasound February 24, 2022 Moderate amount of left-sided atherosclerotic plaque results in elevated peak systolic velocities within the left internal carotid artery compatible with the 50-69% luminal narrowing range. Further evaluation with CTA could be performed as clinically indicated. 2. Minimal amount of right-sided atherosclerotic plaque, notresulting in a hemodynamically significant stenosis.  Lab work reviewed from August 2024 Total cholesterol 216 LDL 116   PMH:   has a past medical history of Hypercholesterolemia, Hypertension, and Stomach ulcer (2006).  PSH:    Past Surgical History:  Procedure Laterality Date   BREAST BIOPSY Left 01/05/2017    PSEUDO-ANGIOMATOUS STROMAL HYPERPLASIA    Current Outpatient Medications  Medication Sig Dispense Refill   Ascorbic Acid (VITAMIN C WITH ROSE HIPS) 500 MG tablet Take 500 mg by mouth daily.     aspirin EC 81 MG tablet Take 81 mg by mouth daily.     atorvastatin (LIPITOR) 20 MG tablet Take 20 mg by mouth daily.     carvedilol (COREG) 25 MG tablet Take 25 mg by mouth 2 (two) times daily with a meal.     enalapril (VASOTEC) 20 MG tablet Take 20 mg by mouth 2 (two) times daily.     Eszopiclone 3 MG TABS Take 3 mg by mouth at bedtime as needed.     hydrochlorothiazide (HYDRODIURIL) 25 MG tablet Take 25 mg by mouth daily.     Multiple Vitamins-Minerals (MULTIVITAMIN WOMEN 50+ PO) Take 1 capsule by mouth daily.      omeprazole (PRILOSEC) 10 MG capsule Take 10 mg by mouth daily.     triamterene-hydrochlorothiazide (DYAZIDE) 37.5-25 MG capsule Take 1 capsule by mouth daily. (Patient not taking: Reported on 03/17/2022)     Vitamin D, Cholecalciferol, 10 MCG (400 UNIT) CAPS Take 1 capsule by mouth daily.     No current facility-administered medications for this visit.     Allergies:   Patient has no known allergies.   Social History:  The patient  reports that she has never smoked. She does not have any smokeless tobacco history on file. She reports that she does not drink alcohol.   Family History:   family history includes Diabetes in her father; Heart attack in her father and mother; Heart disease in her father; Obesity in her mother; Stroke in her mother.    Review of Systems: ROS   PHYSICAL EXAM: VS:  LMP 11/06/2005 (Approximate)  , BMI There is no height or weight on file to calculate BMI. GEN: Well nourished, well developed, in no acute distress HEENT: normal Neck: no JVD, carotid bruits, or masses Cardiac: RRR; no murmurs, rubs, or gallops,no edema  Respiratory:  clear to auscultation bilaterally, normal work of breathing GI: soft, nontender, nondistended, + BS MS: no deformity or atrophy Skin: warm and dry, no rash Neuro:  Strength and sensation are intact Psych: euthymic mood, full affect    Recent Labs: No results found for requested labs within last 365 days.    Lipid Panel No  results found for: "CHOL", "HDL", "LDLCALC", "TRIG"    Wt Readings from Last 3 Encounters:  05/04/23 157 lb 3.2 oz (71.3 kg)  10/28/22 155 lb 12.8 oz (70.7 kg)  03/17/22 161 lb (73 kg)       ASSESSMENT AND PLAN:  Problem List Items Addressed This Visit   None    Disposition:   F/U  12 months   Total encounter time more than 30 minutes  Greater than 50% was spent in counseling and coordination of care with the patient    Signed, Dossie Arbour, M.D., Ph.D. [facility]  [place], [place] [phone]

## 2023-07-07 ENCOUNTER — Ambulatory Visit: Payer: Medicare HMO | Admitting: Cardiology

## 2023-07-07 DIAGNOSIS — R0789 Other chest pain: Secondary | ICD-10-CM

## 2023-07-15 ENCOUNTER — Other Ambulatory Visit: Payer: Medicare HMO

## 2023-08-12 ENCOUNTER — Ambulatory Visit: Payer: Medicare HMO | Admitting: Cardiology

## 2023-10-21 ENCOUNTER — Ambulatory Visit: Payer: Medicare HMO | Attending: Cardiology | Admitting: Cardiology

## 2023-11-02 ENCOUNTER — Encounter (INDEPENDENT_AMBULATORY_CARE_PROVIDER_SITE_OTHER): Payer: Medicare HMO

## 2023-11-02 ENCOUNTER — Ambulatory Visit (INDEPENDENT_AMBULATORY_CARE_PROVIDER_SITE_OTHER): Payer: Medicare HMO | Admitting: Vascular Surgery

## 2023-11-03 ENCOUNTER — Encounter (INDEPENDENT_AMBULATORY_CARE_PROVIDER_SITE_OTHER): Payer: Self-pay

## 2023-11-27 ENCOUNTER — Other Ambulatory Visit (INDEPENDENT_AMBULATORY_CARE_PROVIDER_SITE_OTHER): Payer: Self-pay | Admitting: Nurse Practitioner

## 2023-11-27 DIAGNOSIS — I6523 Occlusion and stenosis of bilateral carotid arteries: Secondary | ICD-10-CM

## 2023-11-29 NOTE — Progress Notes (Deleted)
 MRN : [id number]  Kristi Howell is a 68 y.o. (13-Apr-1956) female who presents with chief complaint of check carotid arteries.  History of Present Illness:  The patient is seen for follow up evaluation of carotid stenosis. The carotid stenosis followed by ultrasound.    The patient denies amaurosis fugax. There is no recent history of TIA symptoms or focal motor deficits. There is no prior documented CVA.   The patient is taking enteric-coated aspirin 81 mg daily.   There is no history of migraine headaches. There is no history of seizures.   No recent shortening of the patient's walking distance or new symptoms consistent with claudication.  No history of rest pain symptoms. No new ulcers or wounds of the lower extremities have occurred.   There is no history of DVT, PE or superficial thrombophlebitis. She notes that she recently been having some chest tightness/pressure.  It does not happen daily but it does happen after she exerted herself for short time.  Once she has sat and rested the pain goes away.   Carotid Duplex done today shows 40 to 59% stenosis on the right ICA with 60 to 79% of the left   No outpatient medications have been marked as taking for the 11/30/23 encounter (Appointment) with Prescilla Brod, Ninette Basque, MD.    Past Medical History:  Diagnosis Date   Hypercholesterolemia    Hypertension    Stomach ulcer 2006    Past Surgical History:  Procedure Laterality Date   BREAST BIOPSY Left 01/05/2017    PSEUDO-ANGIOMATOUS STROMAL HYPERPLASIA    Social History Social History   Tobacco Use   Smoking status: Never  Substance Use Topics   Alcohol use: No    Family History Family History  Problem Relation Age of Onset   Stroke Mother    Heart attack Mother    Obesity Mother    Diabetes Father    Heart disease Father    Heart attack Father    Breast cancer Neg Hx     No Known Allergies   REVIEW OF SYSTEMS (Negative unless  checked)  Constitutional: [] Weight loss  [] Fever  [] Chills Cardiac: [] Chest pain   [] Chest pressure   [] Palpitations   [] Shortness of breath when laying flat   [] Shortness of breath with exertion. Vascular:  [x] Pain in legs with walking   [] Pain in legs at rest  [] History of DVT   [] Phlebitis   [] Swelling in legs   [] Varicose veins   [] Non-healing ulcers Pulmonary:   [] Uses home oxygen   [] Productive cough   [] Hemoptysis   [] Wheeze  [] COPD   [] Asthma Neurologic:  [] Dizziness   [] Seizures   [] History of stroke   [] History of TIA  [] Aphasia   [] Vissual changes   [] Weakness or numbness in arm   [] Weakness or numbness in leg Musculoskeletal:   [] Joint swelling   [] Joint pain   [] Low back pain Hematologic:  [] Easy bruising  [] Easy bleeding   [] Hypercoagulable state   [] Anemic Gastrointestinal:  [] Diarrhea   [] Vomiting  [] Gastroesophageal reflux/heartburn   [] Difficulty swallowing. Genitourinary:  [] Chronic kidney disease   [] Difficult urination  [] Frequent urination   [] Blood in urine Skin:  [] Rashes   [] Ulcers  Psychological:  [] History of anxiety   []  History of major depression.  Physical Examination  There were no vitals filed for this visit. There is no  height or weight on file to calculate BMI. Gen: WD/WN, NAD Head: [place]/AT, No temporalis wasting.  Ear/Nose/Throat: Hearing grossly intact, nares w/o erythema or drainage Eyes: PER, EOMI, sclera nonicteric.  Neck: Supple, no masses.  No bruit or JVD.  Pulmonary:  Good air movement, no audible wheezing, no use of accessory muscles.  Cardiac: RRR, normal S1, S2, no Murmurs. Vascular:  carotid bruit noted Vessel Right Left  Radial Palpable Palpable  Carotid  Palpable  Palpable  Subclav  Palpable Palpable  Gastrointestinal: soft, non-distended. No guarding/no peritoneal signs.  Musculoskeletal: M/S 5/5 throughout.  No visible deformity.  Neurologic: CN 2-12 intact. Pain and light touch intact in extremities.  Symmetrical.  Speech is fluent.  Motor exam as listed above. Psychiatric: Judgment intact, Mood & affect appropriate for pt's clinical situation. Dermatologic: No rashes or ulcers noted.  No changes consistent with cellulitis.   CBC No results found for: WBC, HGB, HCT, MCV, PLT  BMET No results found for: NA, K, CL, CO2, GLUCOSE, BUN, CREATININE, CALCIUM, GFRNONAA, GFRAA CrCl cannot be calculated (No successful lab value found.).  COAG No results found for: INR, PROTIME  Radiology No results found.   Assessment/Plan There are no diagnoses linked to this encounter.   Devon Fogo, MD  11/29/2023 4:38 PM

## 2023-11-30 ENCOUNTER — Ambulatory Visit (INDEPENDENT_AMBULATORY_CARE_PROVIDER_SITE_OTHER): Admitting: Vascular Surgery

## 2023-11-30 ENCOUNTER — Encounter (INDEPENDENT_AMBULATORY_CARE_PROVIDER_SITE_OTHER)

## 2023-11-30 DIAGNOSIS — E782 Mixed hyperlipidemia: Secondary | ICD-10-CM

## 2023-11-30 DIAGNOSIS — I6523 Occlusion and stenosis of bilateral carotid arteries: Secondary | ICD-10-CM

## 2023-11-30 DIAGNOSIS — I739 Peripheral vascular disease, unspecified: Secondary | ICD-10-CM

## 2023-11-30 DIAGNOSIS — I10 Essential (primary) hypertension: Secondary | ICD-10-CM
